# Patient Record
Sex: Male | Born: 1965 | Race: White | Hispanic: No | Marital: Married | Smoking: Current every day smoker
Health system: Southern US, Community
[De-identification: ages and names within clinical notes are randomized; demographics above are authoritative.]

## PROBLEM LIST (undated history)

## (undated) DIAGNOSIS — F1910 Other psychoactive substance abuse, uncomplicated: Secondary | ICD-10-CM

## (undated) DIAGNOSIS — K219 Gastro-esophageal reflux disease without esophagitis: Secondary | ICD-10-CM

## (undated) DIAGNOSIS — D689 Coagulation defect, unspecified: Secondary | ICD-10-CM

## (undated) HISTORY — DX: Gastro-esophageal reflux disease without esophagitis: K21.9

## (undated) HISTORY — PX: OTHER SURGICAL HISTORY: SHX169

## (undated) HISTORY — DX: Other psychoactive substance abuse, uncomplicated: F19.10

## (undated) HISTORY — DX: Coagulation defect, unspecified: D68.9

---

## 2016-10-08 ENCOUNTER — Encounter: Payer: Self-pay | Admitting: Medical

## 2016-10-08 ENCOUNTER — Ambulatory Visit (INDEPENDENT_AMBULATORY_CARE_PROVIDER_SITE_OTHER): Payer: Self-pay | Admitting: Medical

## 2016-10-08 VITALS — BP 124/79 | HR 72 | Temp 97.9°F | Ht 69.75 in | Wt 160.8 lb

## 2016-10-08 DIAGNOSIS — F1011 Alcohol abuse, in remission: Secondary | ICD-10-CM

## 2016-10-08 DIAGNOSIS — Z87898 Personal history of other specified conditions: Secondary | ICD-10-CM

## 2016-10-08 LAB — COMPREHENSIVE METABOLIC PANEL
ALT: 7 U/L — ABNORMAL LOW (ref 9–46)
AST: 16 U/L (ref 10–35)
Albumin: 4.2 g/dL (ref 3.6–5.1)
Alkaline Phosphatase: 86 U/L (ref 40–115)
BUN: 12 mg/dL (ref 7–25)
CHLORIDE: 103 mmol/L (ref 98–110)
CO2: 30 mmol/L (ref 20–31)
CREATININE: 0.93 mg/dL (ref 0.70–1.33)
Calcium: 9.6 mg/dL (ref 8.6–10.3)
GLUCOSE: 92 mg/dL (ref 65–99)
Potassium: 4.6 mmol/L (ref 3.5–5.3)
SODIUM: 141 mmol/L (ref 135–146)
Total Bilirubin: 0.3 mg/dL (ref 0.2–1.2)
Total Protein: 7.1 g/dL (ref 6.1–8.1)

## 2016-10-08 LAB — GAMMA GT: GGT: 17 U/L (ref 7–51)

## 2016-10-08 NOTE — Patient Instructions (Addendum)
Your are a new pt and we don't know you full history. I need to review all your dot forms and review you Detox records. Also we need to do cmp, ggt and urine drug screen test. After these test results are back and we go your records then we can determine if forms can be filled and signs off by us.  Also you will need to get optometrist to fill out his form since you use reading glasses.(bring form filled out)   Please get us records from detox.  Follow up to be determined after lab review.  Can do full physical at later date if you desire.
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
## 2016-10-08 NOTE — Progress Notes (Signed)
Pre visit review using our clinic tool,if applicable. No additional management support is needed unless otherwise documented below in the visit note.  

## 2016-10-11 ENCOUNTER — Telehealth: Payer: Self-pay | Admitting: Medical

## 2016-10-11 DIAGNOSIS — F1011 Alcohol abuse, in remission: Secondary | ICD-10-CM

## 2016-10-11 NOTE — Telephone Encounter (Signed)
What are my options to check alcohol level. Do is blood test to determine level?

## 2016-10-11 NOTE — Telephone Encounter (Signed)
I changed order to one that has alcohol as well.

## 2016-10-11 NOTE — Telephone Encounter (Signed)
I did urine drug screen on pt which I thought included alcohol level. But our uds does not include alcohol. So will you ask pt to come in and give

## 2016-10-21 ENCOUNTER — Telehealth: Payer: Self-pay | Admitting: Medical

## 2016-10-21 NOTE — Telephone Encounter (Signed)
Ok, the original order was for [facility] so that's were the urine went. When you changed the order to a Drug Panel 10 which im seeing in Epic now, that's a [facility] test. The patient needed to come back in for another urine sample, and the order can not be attached to a telephone note to be processed as an order, we can make it a future order.

## 2016-10-21 NOTE — Telephone Encounter (Signed)
Will you advise pt and make sure he was not charged for [facility]?

## 2016-10-21 NOTE — Telephone Encounter (Signed)
I got reminder from epic that result on this study is overdue. Will you see if it was never done? Let me know what deal is?

## 2016-10-26 ENCOUNTER — Telehealth: Payer: Self-pay | Admitting: Medical

## 2016-10-26 DIAGNOSIS — F1011 Alcohol abuse, in remission: Secondary | ICD-10-CM

## 2016-10-26 NOTE — Telephone Encounter (Signed)
Pt gave a drug screen. It took a day or so before urine went out. I had asked to see if alcohol was included on test. I was told no so I had ordered another test see 10/16-2017 order. Pt never came in for recollection. So standard drug screen has run. Pt has a history of alcohol abuse. Pt is trying to get his driving license back. So in addition to drug screen want him to get random urine alcohol. I found one in the computer. Looks like it is [facility]. Pt is a self pay. How much would that tcost to do next time he is in?

## 2016-10-27 ENCOUNTER — Telehealth: Payer: Self-pay | Admitting: Medical

## 2016-10-27 NOTE — Telephone Encounter (Signed)
Spoke with [facility] representative, if patient goes to a [facility] and pays up front there before the test its 55 $ , if patient goes through us to have it done its 142$

## 2016-10-27 NOTE — Telephone Encounter (Signed)
Will you call patient and explain option on urine alcohol test. He may want to do it with [facility].   Can you make copy of my recent etoh urine test and fax it to [facility]. Or can they see it through epic? If he wants to do ti through [facility].   Let him know test we did was for drugs but did not include alcohol.(which I was not aware).  Will you let me know what he says.

## 2016-10-28 ENCOUNTER — Telehealth: Payer: Self-pay | Admitting: Emergency Medicine

## 2016-10-28 ENCOUNTER — Encounter: Payer: Self-pay | Admitting: Emergency Medicine

## 2016-10-28 NOTE — Telephone Encounter (Signed)
I LMOVM For pt to return call. Ramon Dredgedward would like the patient to come back to our office to have another UDS drug screen done. The first one did not have an alcohol level in it and this one will and will go to [facility] not [facility]. KMP

## 2016-10-28 NOTE — Telephone Encounter (Signed)
Hey, I have called the pt several times today and I left him a message to call me back in the Lab. I routed the detailed telephone note to Veatrice KellsHolden David and Angie in case I'm at lunch and can not speak with him. KMP

## 2016-10-28 NOTE — Telephone Encounter (Signed)
Called patient regarding return for Alcohol lever to be drawn. Mother states patient is currently in [facility]. WIll give information to E. Saguier.

## 2016-11-20 ENCOUNTER — Telehealth: Payer: Self-pay | Admitting: Medical

## 2016-11-20 NOTE — Telephone Encounter (Signed)
I am cancelling cbc and drug screen with alcohol level. But if pt comes in please place that order in again. Associate with history of alcohol abuse.

## 2017-01-24 ENCOUNTER — Telehealth: Payer: Self-pay | Admitting: Medical

## 2017-01-24 NOTE — Telephone Encounter (Signed)
Caller name:Rebecca Sizemoore Relationship to patient:mother Can be reached:[phone] Pharmacy:  Reason for call:Requesting call back regarding paperwork for DMV. Ws suppose to be sent back in Oct.
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
## 2017-01-25 NOTE — Telephone Encounter (Signed)
Pt's mother called back.  Discussed provider's note with patient's mother.  Mother stated understanding and stated that patient is still going through the program at [facility] (it's a year long program).  He has not completed it yet, but was told by [facility] that he did not have to complete the program before they make a determination to reinstate his license.  She is unsure of the number of times he's gone to detox, but states that records from [facility] have been sent to [facility], which is why Ian Bryant hasn't received them.  She said patient completed eye exam and had those records [facility].  All that is left is Ian Bryant's portion.  She said all paperwork is due by the first of March.   An office visit with Ian Dredgedward was recommended.  Appt scheduled for tomorrow (01/26/17) at 1:30 pm.  They were encouraged to bring in copies all of the paperwork already sent to [facility] (i.e. Eye Exam report from [facility] [place], [place], records from [facility], etc).  Mother stated understanding and agreed they would comply.

## 2017-01-26 ENCOUNTER — Ambulatory Visit (INDEPENDENT_AMBULATORY_CARE_PROVIDER_SITE_OTHER): Payer: Self-pay | Admitting: Medical

## 2017-01-26 DIAGNOSIS — F1010 Alcohol abuse, uncomplicated: Secondary | ICD-10-CM

## 2017-01-26 NOTE — Patient Instructions (Addendum)
I will make copies of your dot sheets again.  Will get records from [facility] and review those. May need to wait until you complete the program.  Will review your optometry records.   Also review [facility] records when it is printed.(pt brought disk in)  No labs today but will review prior drug screens at [facility].  Follow up date to be determined. Will need to review you case with Dr. Laury AxonLowne. May need to discuss with  DOT as well.  Asked to call us in 2-3 weeks for update regarding if we have received all records.
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
## 2017-01-26 NOTE — Progress Notes (Signed)
Pre visit review using our clinic tool,if applicable. No additional management support is needed unless otherwise documented below in the visit note.  

## 2017-02-01 ENCOUNTER — Telehealth: Payer: Self-pay | Admitting: Medical

## 2017-02-01 NOTE — Telephone Encounter (Signed)
Paperwork I need to review is his records from [facility] regarding detox and rehab etc. Which is on disk. I have not got it printed yet.

## 2017-02-01 NOTE — Telephone Encounter (Signed)
Pt called in to follow up on message sent. Pt would like a call back as soon as possible. Pt isn't aware of what paperwork is about.     CB: [phone]

## 2017-02-01 NOTE — Telephone Encounter (Signed)
I gave envelope/disk to [place]. Placed on her desk. This has a lot of information that I want to review. Please get Ian Bryant to print. This will help me to decide on next steps. Let me review paperework once disk printed then will call pt for follow up. If he wants to discuss something else or having issues maintaining sobriety then follow up sooner. But if do discuss paperwork need to review first.

## 2017-02-02 NOTE — Telephone Encounter (Signed)
Patient's mother called stating that she has brought the patient to be seen by [name] twice regarding this paperwork. She states he does not have insurance and it costs her every time they come back up here. She does not understand why Ramon Dredgedward needs to see the patient again to get the forms filled out when she said that he was seen to get them filled out previously. Please advise  Phone: [phone]

## 2017-02-02 NOTE — Telephone Encounter (Signed)
Per [name], patient needs face-to-face OV [30-minute] to get detailed information before he can fill out any forms; please call patient to inform & schedule appointment/SLS 02/07

## 2017-02-02 NOTE — Telephone Encounter (Signed)
Contacted and informed patient that Ian Bryant would need to see him for an appointment before any further paperwork could be filled out. He states that he would need to speak to his mother before scheduling but would call back with a good time for the appointment.

## 2017-02-04 ENCOUNTER — Telehealth: Payer: Self-pay | Admitting: Medical

## 2017-02-04 NOTE — Telephone Encounter (Signed)
Will you look at fmla paperwork. Call pt and ask him how he wants us to fill out. Is he wanting us to fill out retroactivley for months at rehab. Or going forward for days missed in future. He has substance abuse history. Some pt of mine did rehab in patient then had outpt that was quite extensive. Various meeting per week. And missed work due to [employer] meeting. Dr. Appointment etc. But I remember that pt fmla was quite complicated. So we need more details. They never brought this up at last office visit. We only talked about dot paperwork.

## 2017-02-07 NOTE — Telephone Encounter (Addendum)
Caller name:Sizemore,Rebecca Relation to WJ:XBJYNWpt:mother  Call back number:[phone]   Reason for call:  Mother checking on the status of message below, please advise

## 2017-02-08 ENCOUNTER — Telehealth: Payer: Self-pay | Admitting: Medical
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
## 2017-02-09 ENCOUNTER — Telehealth: Payer: Self-pay | Admitting: Medical

## 2017-02-09 NOTE — Telephone Encounter (Signed)
Called and left a message for call back  

## 2017-02-09 NOTE — Telephone Encounter (Addendum)
Spoke with patient's mother, Lurena JoinerRebecca - [HIPAA] to find out which paperwork he is needing completed [FMLA/Disability and/or DOT], she stated that there should not be any FMLA/Disability because has been in rehab and has not had a job until his new job that he just started yesterday. Explained that Ramon Dredgedward will not be filling out DOT paperwork to release patient to drive without a face-to-face visit And provider/rehab office notes. After explaining some more to pt's mother that this is necessary and if he didn't have any paperwork that Ramon Dredgedward would not be able to sign any DOT paperwork, pt's mother stated "he doesn't have any paperwork, they call him on the phone"; explained that patient did have providers, including psychiatry, attending to his health care while in rehab; and that providers do have office notes on their care for patient while in their facility, which would be needed to review at patient's OV on 02/10/17. Pt's Mom states that he will still be under Rehab's care until March and/or April 2018. Explained to her that she will need to contact the rehab office and let them know that she needs the office notes from patient's recent rehab in-facility treatment for upcoming PCP office visit and that they made need to sign release of medical information form. Pt's mother stated that she will "just bring in what paperwork they have at home" for PCP appointment; reiterated the importance of having the notes from pt's recent in-facility rehab treatment/SLS 02/14

## 2017-02-09 NOTE — Telephone Encounter (Addendum)
Mother returning call best [phone]

## 2017-02-09 NOTE — Telephone Encounter (Signed)
I agree with you ---- cant the psych at the rehab fill out papers?  Or are they not willing to clear him?  Is that the problem.   You are right to want records.

## 2017-02-09 NOTE — Telephone Encounter (Addendum)
Dr Laury AxonLowne,   Need your advise/opinion. This is patient I had discussed with you many weeks ago and I think maybe briefly recently. Hx of dwi(possible not sure) and he came in to see me wanting me to fill out DOT sheets stating he is good to o and can get get license back.   I told pt and his mom I would not until we got records from his prior rehabs and talk with you.  He is currently in rehab and has not finished. Pt scheduled tomorrow. I am going to tell pt and mom that his psychiatrist in rehab or other facility where he has been to fill out substance abuse form. But I am also of opinion when I sign off I need to know he completed the rehab. And review notes from that facility.   Wanted to know if you are in agreement. If so then I could explain this to pt and mom as they have expressed frustration with cost of visits(mom paying for his visits). I could save them waiting time and cost before they come in.  Thanks [name]

## 2017-02-10 ENCOUNTER — Telehealth: Payer: Self-pay | Admitting: Medical

## 2017-02-10 ENCOUNTER — Ambulatory Visit: Payer: Self-pay | Admitting: Medical

## 2017-02-10 NOTE — Telephone Encounter (Signed)
Caller name:Rebecca Sizemore Relationship to patient: Can be reached: Pharmacy:  Reason for call:Would like to speak with provider regarding DOT paperwork

## 2017-02-10 NOTE — Telephone Encounter (Signed)
Mother returning call stating she missed a call, please advise [phone] (H)
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
## 2017-02-21 NOTE — Telephone Encounter (Signed)
I tried to call pt today. Speak with him or his mother about the process of getting paperwork filled out. Apologized for delay but explained have been busy. Will get RN staff to call and summarize the process as recommended by Dr. Laury AxonLowne. If staff unable to speak to patient will try to type letter explaining the process.

## 2017-02-21 NOTE — Telephone Encounter (Signed)
Mother returned call, please clarify if patient needs appointment, mother would like to speak with nurse directly best # [phone] (H)

## 2017-02-21 NOTE — Telephone Encounter (Signed)
I called and explained to mother of patient that we need optometrist form filled out for dot paperwork, psychiatrist portion filled by specialist and counselor to fill out the substance abuse history form. He has until April when he will graduate from rehab.  I explained this is Dr. Laury AxonLowne requirement as Supervisor and we have to proceed this way.   Mom states that optometrist form already faxed to DOT. So she states not necessary to fax again and she confirmed that they got form. So when I sign the package in future will notify dot they they already have form per pt.  We are now waiting for  psychiatry page filled out and substance abuse hx for  filled out I will be able to fill out.   Then I will review and fill out my portion. Mother of pt was made aware of this and seemed frustrated. She hung abruptly.

## 2017-03-08 NOTE — Telephone Encounter (Signed)
I have the final forms and his records in my office. Let me know when he signs those 2 sheets and the will give you 3rd sheet.

## 2017-03-08 NOTE — Telephone Encounter (Signed)
I talked with Dr. Laury AxonLowne again about Ian Bryant. She state I can go ahead and fill out our section but to explain pt diagnosis and that DOT should review forms that specialist will fill out regarding his eye history, mental health section and substance abuse history.   Looks like pt never filled out Radiation protection practitionerDriver license information form or consent information form. I am making copies of these. If they would come by and have Ian Bryant fill these out. Then our office will fax all 3 sheets. Explain to Ian Bryant he should get his pscyhiatrist and counselor to fill out the mental health section pg 5 and substance abuse hx page 6.   Have them come in this week to sign sheets.   Also maybe remind him forms that I have asked him to get specialist to fill out forms. When he gets those filled out they should go directly to dot(his mother states they already sent eye dr. Nat MathForm). Looks like they want all forms by 03-17-2017 if I understand his forms correctly.

## 2017-03-08 NOTE — Telephone Encounter (Signed)
Mother returning call stating no one is home during the day and would like a return call before the end of the business day today, regarding message below, please advise best # [phone]

## 2017-03-08 NOTE — Telephone Encounter (Signed)
LMOM with contact name and number for return call RE: deadline on forms approaching [03/17/17] [place], provider has completed his portion but patient needs to come in to office as soon as he can this week to complete Information & consent forms before we can fax our completed section [will be at front desk]; also, reminded that patient will need to get his specialist to complete other designated section of forms before his deadline given by DOT/SLS 03/13

## 2017-03-10 NOTE — Telephone Encounter (Signed)
Do you have time to talk to Ian Bryant or his mom. They called back.

## 2017-03-14 ENCOUNTER — Telehealth: Payer: Self-pay | Admitting: Medical

## 2017-03-14 NOTE — Telephone Encounter (Signed)
Patient Unavailable; spoke with patient's Mother, Lurena JoinerRebecca; reiterated again that patient needs to come in and complete and sign patient portion [facility] to [facility] deadline of 03/17/2017 before we can fax our portion over in time. Pt's mother was once again reminded that pt's specialist [I.e, [facility]] will also need to complete their sections by deadline, as PCP is only completing his portion. Pt's mother then stated that patient will be in on 03/18/17 to complete paperwork, as she "thinks he received an extension to April"; reiterated that we can only verify what was on the original forms given to us [386-months that would be 03/17/17] and that we cannot guarantee her that he will not be made to restart the paperwork process over again. Pt's mother then stated that there was no physician, only counselors in Rehab; informed that any Inpatient Rehab must have a physician [psych] to handle patient's medical care. She once again stated that patient will be in on 03/17/17 to complete patient information form. Provider made aware/SLS 03/19

## 2017-03-14 NOTE — Telephone Encounter (Signed)
I had never had time to call pt. So would you have him come in and fill out his forms. No appointment. Just sign form giving us permission. Sheets that should have been filled out by them when packet was dropped of.

## 2017-03-14 NOTE — Telephone Encounter (Signed)
Were you able to get in touch with patient and/or patient's mom in the evening [since not home during the day per phone note]/SLS 03/19

## 2017-03-18 DIAGNOSIS — Z0279 Encounter for issue of other medical certificate: Secondary | ICD-10-CM

## 2017-03-27 ENCOUNTER — Telehealth: Payer: Self-pay | Admitting: Medical

## 2017-03-27 NOTE — Telephone Encounter (Signed)
I have the forms that we can fax to dot.   Will you make a copy of the forms.  I want to explain which ones I  want to send over.   Will be consent form. [place]  Recovery program letter from Dietitian.and substance abuse sheet. I will also give you sheet that I filled out.   Thanks,

## 2017-03-29 NOTE — Telephone Encounter (Addendum)
Caller name:Sizemore,Rebecca Relation to pt: mother Call back number: [phone]    Reason for call:  Mother returned call regarding fax # [phone]

## 2017-03-29 NOTE — Telephone Encounter (Signed)
Called patient to inquire about fax number for paperwork.  Left a message for call back.

## 2017-03-30 NOTE — Telephone Encounter (Signed)
Forms faxed. Fax confirmation received

## 2018-01-25 ENCOUNTER — Encounter: Payer: Self-pay | Admitting: Medical

## 2018-01-25 ENCOUNTER — Ambulatory Visit (INDEPENDENT_AMBULATORY_CARE_PROVIDER_SITE_OTHER): Payer: Self-pay | Admitting: Medical

## 2018-01-25 VITALS — BP 110/64 | HR 79 | Temp 98.0°F | Resp 16 | Wt 158.2 lb

## 2018-01-25 DIAGNOSIS — R1031 Right lower quadrant pain: Secondary | ICD-10-CM

## 2018-01-25 DIAGNOSIS — K4090 Unilateral inguinal hernia, without obstruction or gangrene, not specified as recurrent: Secondary | ICD-10-CM
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
## 2018-01-25 NOTE — Patient Instructions (Signed)
By exam and review of hospital discharge notes, it appears that you have a inguinal hernia.  I am going to go ahead and refer you to general surgeon as the hernia is moderately large and sometimes there can be complications as we discussed today.  It might be better to have a scheduled elective surgery rather than surgery done in the emergency condition provided complications were to arise.  Please get CBC today.  In the event you were have acute severe increasing pain in the right groin area or other symptoms as I explained then recommend be seen in the emergency department for stat workup.  While the referral to surgeon is pending would recommend that she try not to lift heavy things as this might make the hernia enlarge.  If you do not get a call by this coming Monday regarding the referral that I want you to call here and pass message to [facility] to investigate the referral.   Please sign a release of information form so I can review her imaging study and [facility].  Follow-up in 10 days or as needed.

## 2018-01-26 LAB — CBC WITH DIFFERENTIAL/PLATELET
BASOS ABS: 0.1 10*3/uL (ref 0.0–0.1)
BASOS PCT: 0.8 % (ref 0.0–3.0)
EOS ABS: 0.1 10*3/uL (ref 0.0–0.7)
Eosinophils Relative: 1.4 % (ref 0.0–5.0)
HCT: 38.9 % — ABNORMAL LOW (ref 39.0–52.0)
Hemoglobin: 13.4 g/dL (ref 13.0–17.0)
Lymphocytes Relative: 31.9 % (ref 12.0–46.0)
Lymphs Abs: 2.6 10*3/uL (ref 0.7–4.0)
MCHC: 34.4 g/dL (ref 30.0–36.0)
MCV: 90.4 fl (ref 78.0–100.0)
MONO ABS: 0.6 10*3/uL (ref 0.1–1.0)
Monocytes Relative: 6.8 % (ref 3.0–12.0)
NEUTROS ABS: 4.8 10*3/uL (ref 1.4–7.7)
Neutrophils Relative %: 59.1 % (ref 43.0–77.0)
PLATELETS: 194 10*3/uL (ref 150.0–400.0)
RBC: 4.3 Mil/uL (ref 4.22–5.81)
RDW: 13.5 % (ref 11.5–15.5)
WBC: 8.1 10*3/uL (ref 4.0–10.5)

## 2019-01-23 DIAGNOSIS — R0902 Hypoxemia: Secondary | ICD-10-CM

## 2019-01-23 DIAGNOSIS — I959 Hypotension, unspecified: Secondary | ICD-10-CM

## 2019-01-23 DIAGNOSIS — R509 Fever, unspecified: Secondary | ICD-10-CM

## 2019-01-29 ENCOUNTER — Telehealth: Payer: Self-pay

## 2019-01-29 NOTE — Telephone Encounter (Signed)
Copied from CRM [id number]. Topic: General - Other >> Jan 29, 2019 12:16 PM Jilda Rocheemaray, Melissa wrote: Reason for CRM: Patient's mom calling for him asking the "exact" amount of his office visit, he has no Insurance. Please advise  Best call back is [phone]

## 2019-01-30 NOTE — Telephone Encounter (Signed)
Called patient back and left a generic message stating "returning a call for someone in your household inquiring about exact charges for a self-pay visit."  Explained we have no way of know the exact charges as the provided codes based on the level of the visit in addition they may perform tests/labs that we also have no way of knowing until the time of service.  Advised they can call back and we can give them an estimate but that is all it will be is an estimate to the best of our ability.- SLB

## 2019-02-01 ENCOUNTER — Ambulatory Visit (INDEPENDENT_AMBULATORY_CARE_PROVIDER_SITE_OTHER): Payer: Self-pay | Admitting: Medical

## 2019-02-01 ENCOUNTER — Encounter: Payer: Self-pay | Admitting: Medical

## 2019-02-01 ENCOUNTER — Ambulatory Visit (HOSPITAL_BASED_OUTPATIENT_CLINIC_OR_DEPARTMENT_OTHER)
Admission: RE | Admit: 2019-02-01 | Discharge: 2019-02-01 | Disposition: A | Discharge status: Home / Self Care | Payer: Self-pay | Source: Ambulatory Visit | Attending: Medical | Admitting: Medical

## 2019-02-01 VITALS — BP 112/75 | HR 70 | Temp 98.1°F | Resp 14 | Ht 69.0 in | Wt 161.0 lb

## 2019-02-01 DIAGNOSIS — Z125 Encounter for screening for malignant neoplasm of prostate: Secondary | ICD-10-CM

## 2019-02-01 DIAGNOSIS — R059 Cough, unspecified: Secondary | ICD-10-CM

## 2019-02-01 DIAGNOSIS — Z113 Encounter for screening for infections with a predominantly sexual mode of transmission: Secondary | ICD-10-CM

## 2019-02-01 DIAGNOSIS — R509 Fever, unspecified: Secondary | ICD-10-CM

## 2019-02-01 DIAGNOSIS — R05 Cough: Secondary | ICD-10-CM | POA: Insufficient documentation

## 2019-02-01 LAB — COMPREHENSIVE METABOLIC PANEL
ALT: 15 U/L (ref 0–53)
AST: 17 U/L (ref 0–37)
Albumin: 4.4 g/dL (ref 3.5–5.2)
Alkaline Phosphatase: 75 U/L (ref 39–117)
BUN: 8 mg/dL (ref 6–23)
CALCIUM: 9.6 mg/dL (ref 8.4–10.5)
CHLORIDE: 101 meq/L (ref 96–112)
CO2: 31 meq/L (ref 19–32)
Creatinine, Ser: 0.86 mg/dL (ref 0.40–1.50)
GFR: 93.05 mL/min (ref >60.00)
GLUCOSE: 90 mg/dL (ref 70–99)
POTASSIUM: 5.1 meq/L (ref 3.5–5.1)
Sodium: 139 mEq/L (ref 135–145)
Total Bilirubin: 0.4 mg/dL (ref 0.2–1.2)
Total Protein: 7.5 g/dL (ref 6.0–8.3)

## 2019-02-01 LAB — CBC WITH DIFFERENTIAL/PLATELET
Basophils Absolute: 0 10*3/uL (ref 0.0–0.1)
Basophils Relative: 0.5 % (ref 0.0–3.0)
EOS ABS: 0.1 10*3/uL (ref 0.0–0.7)
EOS PCT: 0.8 % (ref 0.0–5.0)
HEMATOCRIT: 44.1 % (ref 39.0–52.0)
HEMOGLOBIN: 14.7 g/dL (ref 13.0–17.0)
Lymphocytes Relative: 29.4 % (ref 12.0–46.0)
Lymphs Abs: 2.4 10*3/uL (ref 0.7–4.0)
MCHC: 33.4 g/dL (ref 30.0–36.0)
MCV: 90.2 fl (ref 78.0–100.0)
MONO ABS: 1 10*3/uL (ref 0.1–1.0)
Monocytes Relative: 11.7 % (ref 3.0–12.0)
NEUTROS PCT: 57.6 % (ref 43.0–77.0)
Neutro Abs: 4.7 10*3/uL (ref 1.4–7.7)
Platelets: 257 10*3/uL (ref 150.0–400.0)
RBC: 4.89 Mil/uL (ref 4.22–5.81)
RDW: 13.5 % (ref 11.5–15.5)
WBC: 8.1 10*3/uL (ref 4.0–10.5)

## 2019-02-01 LAB — PSA: PSA: 1.87 ng/mL (ref 0.10–4.00)
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
## 2019-02-01 NOTE — Patient Instructions (Signed)
For your history of recent hospitalization with possible sepsis with undetermined source of infection, I do want to get a chest x-ray today, CBC, CMP, PSA and HIV screen.  I want to review your chest x-ray first but might be giving you an antibiotic depending on results of chest x-ray.  I do want you to sign release of records forms from the hospital so I can review those and see if I need to do additional work-up.  If you get any recurrent fever please let me know.  If any recurrent fever with no identified source then would have to expand work-up and might even need to send you to specialist.  Regarding your DMV paperwork, I need to review those and fill out my portion.  You have the substance abuse both forms and optometrist forms so specialist can fill those out.  I think it would have you go ahead and call the state and asked for an extension.  Getting specialist to fill out the forms by 24 February would be challenging.  You are above 50 and without insurance.  If you want me to refer you for colonoscopy just let me know and I will do so.  Follow-up date to be determined after review of labs today and review of records from [facility].

## 2019-02-02 LAB — HIV ANTIBODY (ROUTINE TESTING W REFLEX): HIV 1&2 Ab, 4th Generation: NONREACTIVE

## 2019-02-06 ENCOUNTER — Telehealth: Payer: Self-pay | Admitting: *Deleted

## 2019-02-06 ENCOUNTER — Telehealth: Payer: Self-pay

## 2019-02-06 NOTE — Telephone Encounter (Signed)
Received Medical records from [facility]; forwarded to provider/SLS 02/11

## 2019-02-06 NOTE — Telephone Encounter (Signed)
Copied from CRM [id number]. Topic: General - Inquiry >> Feb 06, 2019 10:56 AM Baldo Daub L wrote: Reason for CRM:   Pt's mother calling to get blood work and chest x-ray results.  Per NT not able to tell pt anything.

## 2019-02-07 NOTE — Telephone Encounter (Signed)
Attempted to contact pt; he is at work; results given to his mother, Ian Bryant (name listed on dpr; " Notes recorded by Esperanza Richters, PA-C on 02/01/2019 at 5:28 PMntact pt to give him results of chest xray  EST No pneumonia seen." and "Notes recorded by Esperanza Richters, PA-C on 02/01/2019 at 5:28 PM EST Your chest xray was clear but hyperinflated. Hyperinflation can go along with smoking."; she verbalize understanding, and will also relay this information to the pt; Ian Bryant also inquires about paperwork for [facility] that was to be signed; unable to locate paperwork; spoke with Jazmine and she is also unable to locate this; Jazmine requests that the pt be notified that she will call her this pm 02/07/2019 or the morning of 02/08/2019; Ian Bryant verbalizes understanding and says that she can be contacted at [phone] (home); a message can be left; she will be at home the remainder of the afternoon, but she has appointment on 02/08/2019 and will not be available until after 1400; unable to chart in result note because no encounter created; will also route to office per request.

## 2019-02-07 NOTE — Telephone Encounter (Signed)
Tried to reach pt no answer. Okay for PEC to give information.

## 2019-02-07 NOTE — Telephone Encounter (Signed)
Attempted to contact pt; unable to leave message; rapid busy signal.

## 2019-02-08 NOTE — Telephone Encounter (Signed)
Spoke with pt's mom. Pt's mom states she has the other forms that has to be filled out she just needs PCP to fill out his part. Paperwork has to be filled out by fill out by 02/13/19.

## 2019-02-11 ENCOUNTER — Telehealth: Payer: Self-pay | Admitting: Medical

## 2019-02-11 NOTE — Telephone Encounter (Signed)
Note    I filled out pt dot sheet. But advise patient that they need to get eye MD to fill out visual impairment sheet. Get cousnelor/mental health professional  to fill out the mental health and substance abuse sheet.  Make copy and fax sheet I filled out. Since pt/mom express deadline for tomorrow. Let them know they need to fax specialist sections.  Carollee Herter,  Will you make sure he gets charged for me filling out this form.

## 2019-02-11 NOTE — Telephone Encounter (Signed)
I filled out pt dot sheet. But advise patient that they need to get eye MD to fill out visual impairment sheet. Get cousnelor/mental health professional  to fill out the mental health and substance abuse sheet.  Make copy and fax sheet I filled out. Since pt/mom express deadline for tomorrow. Let them know they need to fax specialist sections.  Carollee Herter,  Will you make sure he gets charged for me filling out this form.

## 2019-02-12 NOTE — Telephone Encounter (Signed)
Pt's mother is already aware they pt has to get forms filled out by eye Dr. And mental health. Pt just need part for PCP to filled out mailed back to them.

## 2019-02-13 NOTE — Telephone Encounter (Signed)
Forms filled out and mailed to pt's mother.

## 2019-02-13 NOTE — Telephone Encounter (Signed)
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

## 2019-02-14 NOTE — Telephone Encounter (Signed)
No ma'am there wasn't one on it when I got I

## 2020-03-12 IMAGING — DX DG CHEST 2V
2 series · 2 of 2 positions shown · non-contrast
Comparison: 01/23/2019

CLINICAL DATA: Still has cough since dc'd from hospital on [REDACTED]
for blood infection. Smoker.

EXAM:
CHEST - 2 VIEW

[chest pa]
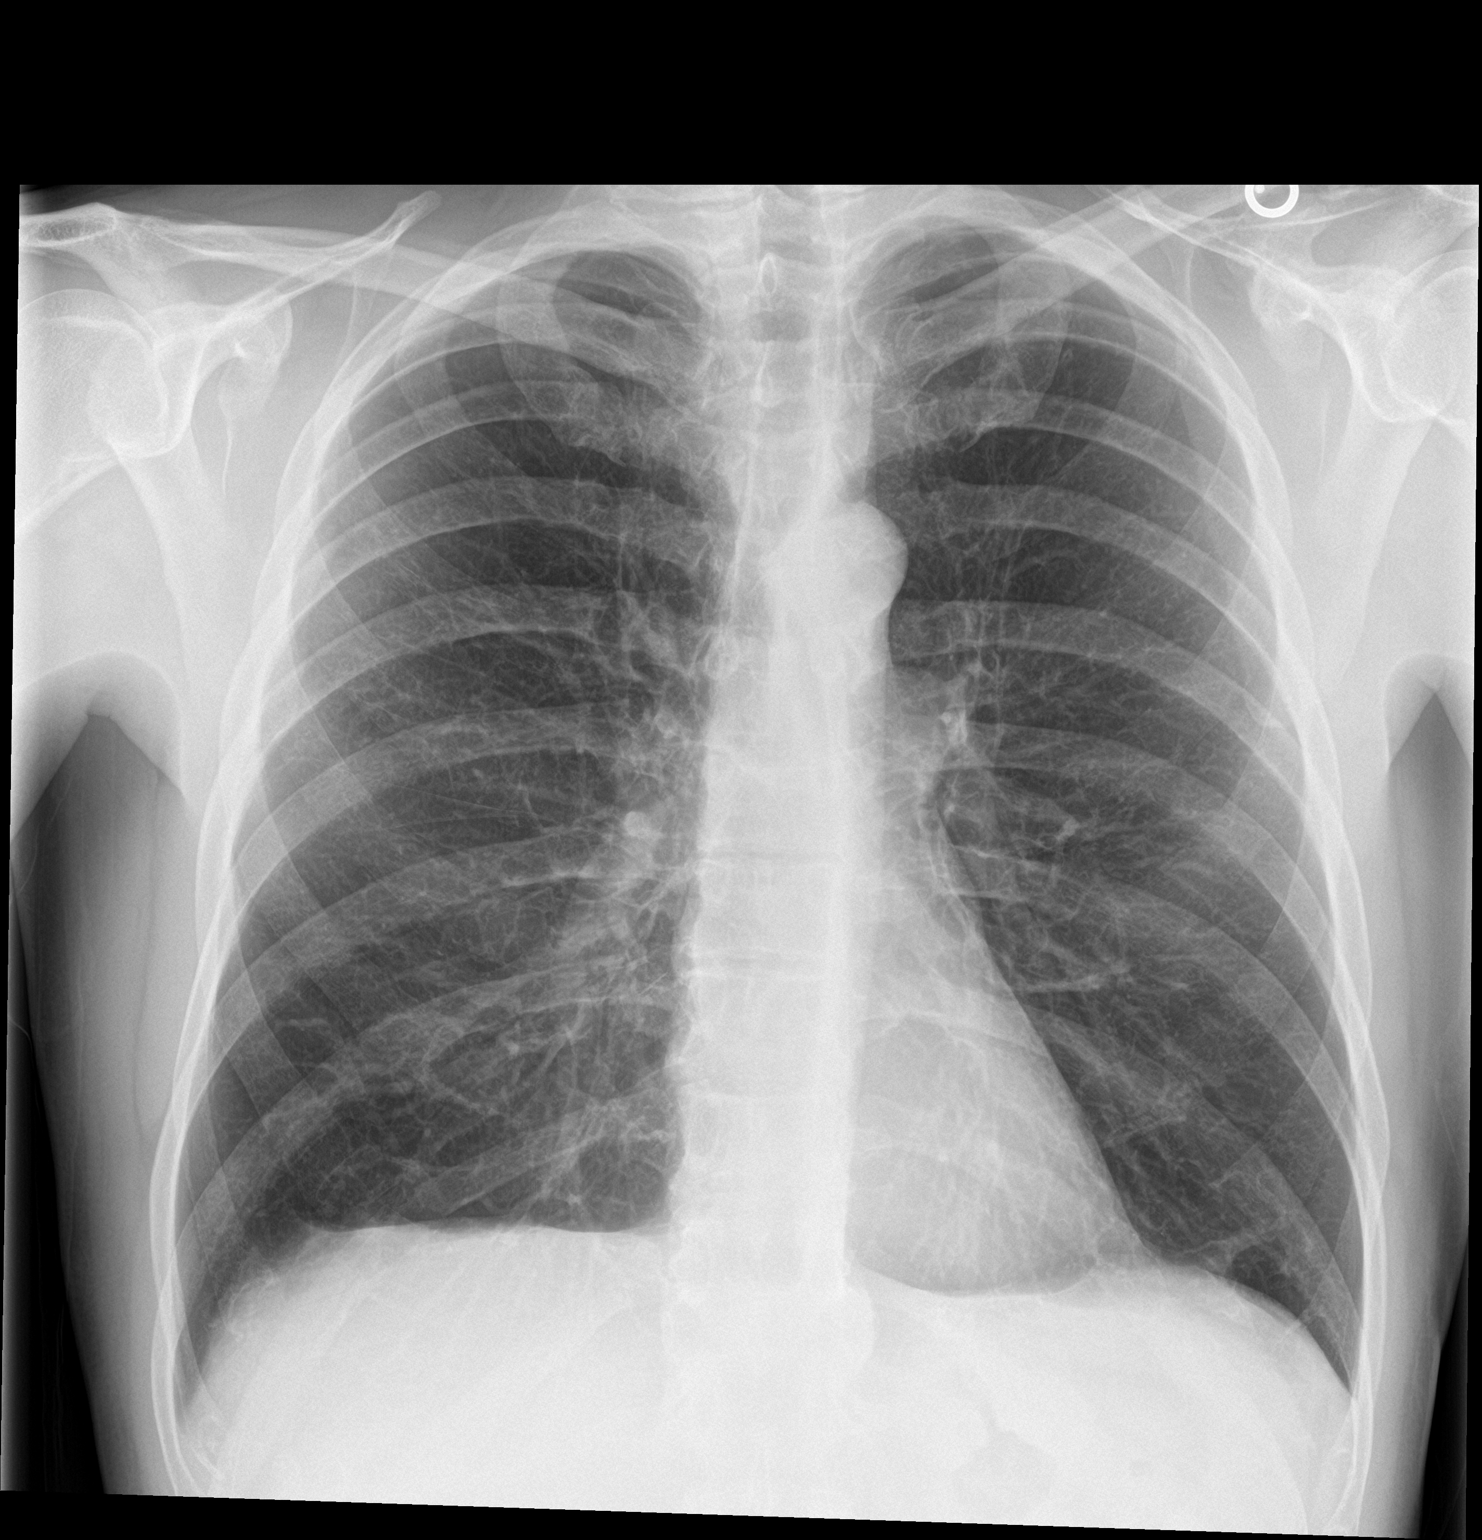

[chest lat]
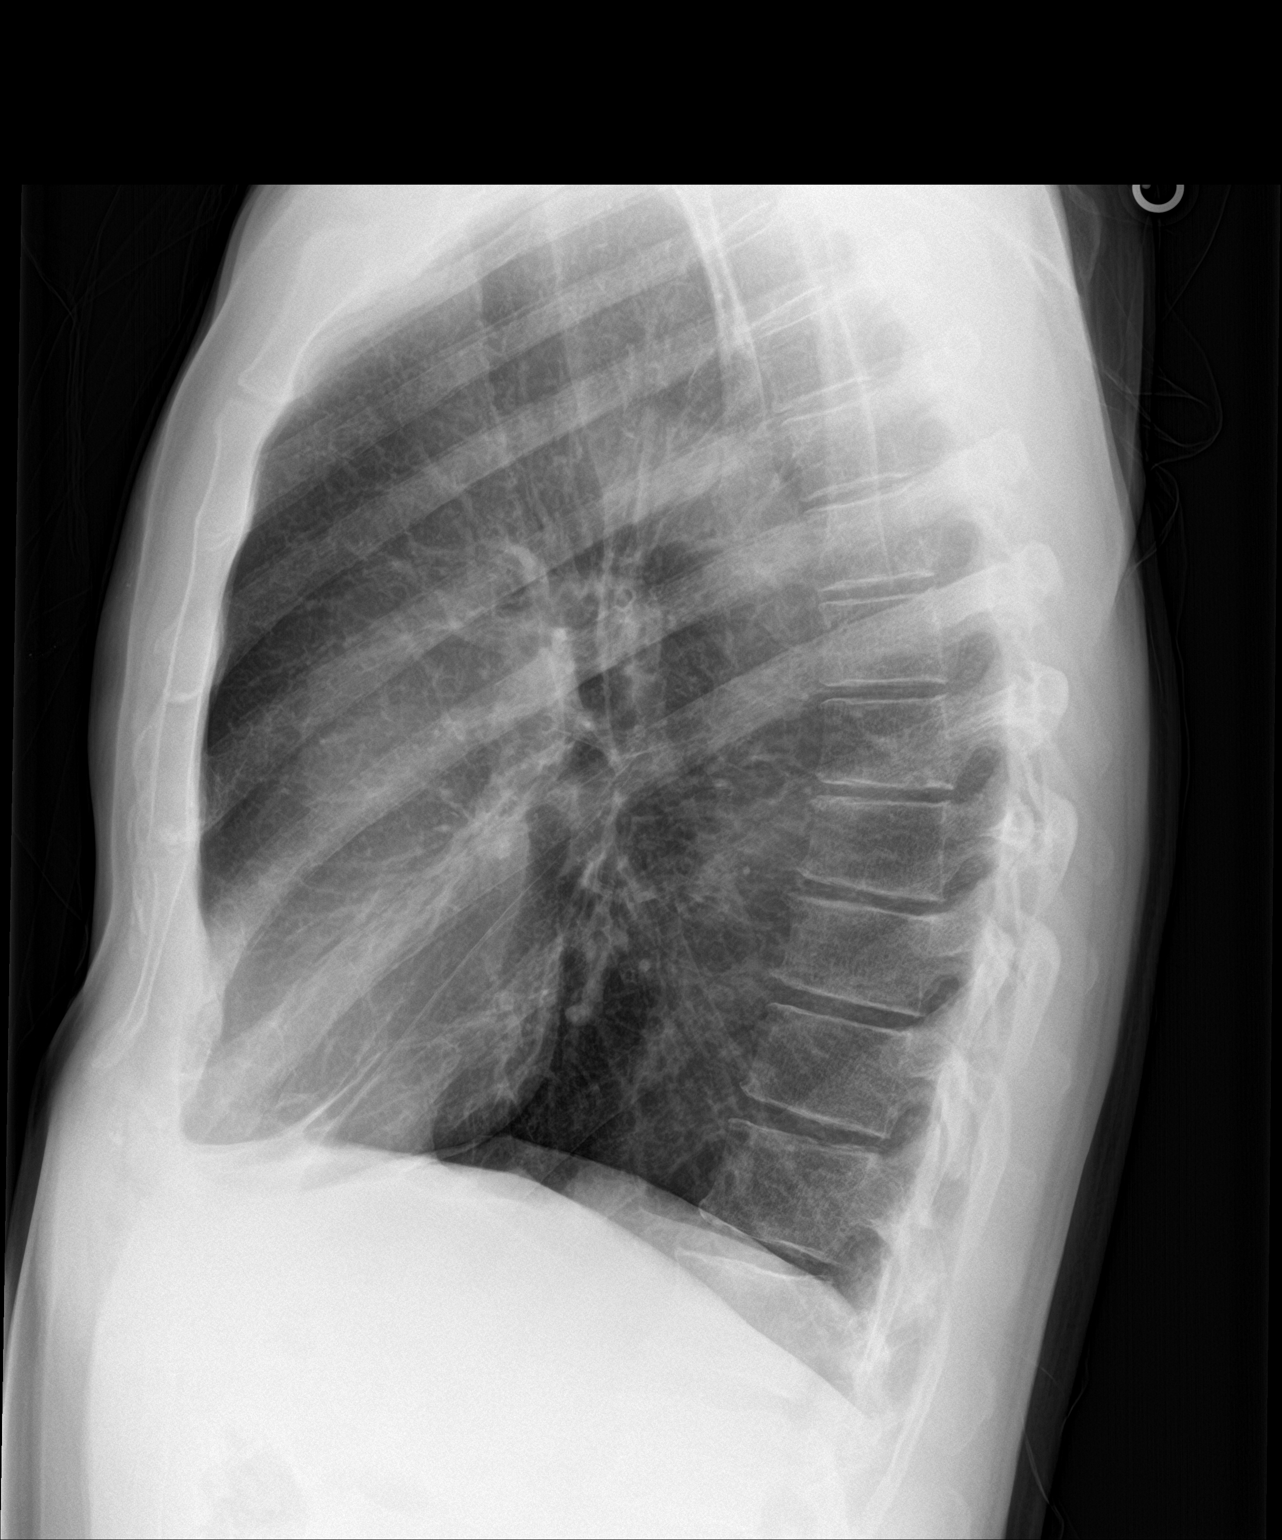

[2 of 2 positions shown; findings below may reference images not displayed]

FINDINGS: Lungs are hyperinflated, clear.

Heart size and mediastinal contours are within normal limits.

No effusion.

Visualized bones unremarkable.
IMPRESSION: No acute cardiopulmonary disease.

## 2020-09-17 ENCOUNTER — Telehealth: Payer: Self-pay | Admitting: Medical

## 2020-09-17 NOTE — Telephone Encounter (Signed)
Mailed out [employer], [employer]) for pt to have, pt was supposed to pick up at front desk, pt never picked up, mailed to pt's address.
